# Patient Record
Sex: Male | Born: 1990 | Race: Black or African American | Hispanic: No | Marital: Single | Smoking: Current every day smoker
Health system: Southern US, Community
[De-identification: ages and names within clinical notes are randomized; demographics above are authoritative.]

---
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
## 2008-12-10 ENCOUNTER — Emergency Department (HOSPITAL_COMMUNITY): Admission: EM | Admit: 2008-12-10 | Discharge: 2008-12-11 | Payer: Self-pay | Admitting: Emergency Medicine

## 2010-09-08 LAB — GC/CHLAMYDIA PROBE AMP, GENITAL
Chlamydia, DNA Probe: POSITIVE — AB
GC Probe Amp, Genital: NEGATIVE

## 2010-09-08 LAB — RPR: RPR Ser Ql: NONREACTIVE

## 2011-06-12 ENCOUNTER — Emergency Department (HOSPITAL_COMMUNITY): Payer: Worker's Compensation

## 2011-06-12 ENCOUNTER — Encounter (HOSPITAL_COMMUNITY): Payer: Self-pay | Admitting: *Deleted

## 2011-06-12 ENCOUNTER — Emergency Department (HOSPITAL_COMMUNITY)
Admission: EM | Admit: 2011-06-12 | Discharge: 2011-06-13 | Disposition: A | Discharge status: Home / Self Care | Payer: Worker's Compensation | Attending: Emergency Medicine | Admitting: Emergency Medicine

## 2011-06-12 DIAGNOSIS — M79609 Pain in unspecified limb: Secondary | ICD-10-CM | POA: Insufficient documentation

## 2011-06-12 DIAGNOSIS — Y990 Civilian activity done for income or pay: Secondary | ICD-10-CM | POA: Insufficient documentation

## 2011-06-12 DIAGNOSIS — S62639B Displaced fracture of distal phalanx of unspecified finger, initial encounter for open fracture: Secondary | ICD-10-CM | POA: Insufficient documentation

## 2011-06-12 DIAGNOSIS — S61209A Unspecified open wound of unspecified finger without damage to nail, initial encounter: Secondary | ICD-10-CM | POA: Insufficient documentation

## 2011-06-12 DIAGNOSIS — Y9229 Other specified public building as the place of occurrence of the external cause: Secondary | ICD-10-CM | POA: Insufficient documentation

## 2011-06-12 DIAGNOSIS — W230XXA Caught, crushed, jammed, or pinched between moving objects, initial encounter: Secondary | ICD-10-CM | POA: Insufficient documentation

## 2011-06-12 DIAGNOSIS — S62609B Fracture of unspecified phalanx of unspecified finger, initial encounter for open fracture: Secondary | ICD-10-CM

## 2011-06-12 DIAGNOSIS — S6990XA Unspecified injury of unspecified wrist, hand and finger(s), initial encounter: Secondary | ICD-10-CM | POA: Insufficient documentation

## 2011-06-12 NOTE — ED Notes (Signed)
Pt to ED c/o smashing L middle finger in sliding door and cutting into nail bed.  Active bleeding noted through nail bed and pt c/o numbness and tingling.

## 2011-06-12 NOTE — ED Notes (Signed)
He was working at [employer] and he caught his lt middle finger tip in the drive through window.  His fingernail was partially torn loose from the nail bed.  Minimal bleeding at present

## 2011-06-13 MED ORDER — HYDROCODONE-ACETAMINOPHEN 5-325 MG PO TABS: 1.0000 | ORAL_TABLET | ORAL | Status: AC | PRN

## 2011-06-13 MED ORDER — OXYCODONE-ACETAMINOPHEN 5-325 MG PO TABS
1.0000 | ORAL_TABLET | Freq: Once | ORAL | Status: AC
  Administered 2011-06-13: 1 via ORAL
  Filled 2011-06-13: qty 1

## 2011-06-13 MED ORDER — CEFAZOLIN SODIUM 1 G IJ SOLR
1.0000 g | Freq: Once | INTRAMUSCULAR | Status: AC
  Administered 2011-06-13: 1 g via INTRAMUSCULAR
  Filled 2011-06-13 (×2): qty 10

## 2011-06-13 MED ORDER — TETANUS-DIPHTH-ACELL PERTUSSIS 5-2.5-18.5 LF-MCG/0.5 IM SUSP
0.5000 mL | Freq: Once | INTRAMUSCULAR | Status: AC
  Administered 2011-06-13: 0.5 mL via INTRAMUSCULAR
  Filled 2011-06-13: qty 0.5

## 2011-06-13 MED ORDER — IBUPROFEN 800 MG PO TABS
800.0000 mg | ORAL_TABLET | Freq: Once | ORAL | Status: AC
  Administered 2011-06-13: 800 mg via ORAL
  Filled 2011-06-13: qty 1

## 2011-06-13 NOTE — ED Provider Notes (Signed)
History     CSN: [id number]  Arrival date & time 06/12/11  2127   First MD Initiated Contact with Patient 06/13/11 0020      Chief Complaint  Patient presents with  . Finger Injury     Patient is a 21 y.o. male presenting with hand pain.  Hand Pain This is a new problem. The current episode started today. The problem occurs constantly. The problem has been gradually worsening.  Pt reports he was working at [employer] when he slammed the "drive through" window on his (L) middle finger. Has been difficult to get bleeding to stop.  History reviewed. No pertinent past medical history.  History reviewed. No pertinent past surgical history.  No family history on file.  History  Substance Use Topics  . Smoking status: Current Everyday Smoker  . Smokeless tobacco: Not on file  . Alcohol Use: Yes      Review of Systems  Constitutional: Negative.   HENT: Negative.   Eyes: Negative.   Respiratory: Negative.   Cardiovascular: Negative.   Gastrointestinal: Negative.   Genitourinary: Negative.   Musculoskeletal: Negative.   Skin: Negative.   Neurological: Negative.   Hematological: Negative.   Psychiatric/Behavioral: Negative.     Allergies  Review of patient's allergies indicates no known allergies.  Home Medications   Current Outpatient Rx  Name Route Sig Dispense Refill  . HYDROCODONE-ACETAMINOPHEN 5-325 MG PO TABS Oral Take 1 tablet by mouth every 4 (four) hours as needed for pain (1 to 2 tabs q4-6h PRN pain). 20 tablet 0    BP 148/98  Pulse 81  Temp(Src) 98.4 F (36.9 C) (Oral)  Resp 18  SpO2 99%  Physical Exam  Constitutional: He is oriented to person, place, and time. He appears well-developed and well-nourished.  HENT:  Head: Normocephalic and atraumatic.  Eyes: Conjunctivae are normal.  Neck: Neck supple.  Cardiovascular: Normal rate.   Pulmonary/Chest: Effort normal.  Musculoskeletal: Normal range of motion.       Hands:      Partial nail  avulsion to (L) middle finger. Soft tissue of exposed nailbed somewhat macerated, remaining nail to proximal portion of nailbed intact. Bleeding controlled.  Neurological: He is alert and oriented to person, place, and time.  Skin: Skin is warm and dry.  Psychiatric: He has a normal mood and affect.    ED Course  Procedures wound cleaned well. Xray shows mildly displaced comminuted fx of finger tuft (L) 3rd finger. Will consult w/ hand for plan.  0125:  I have spoken w/ Dr Amanda Pea by phone who has recommended  bulky dressing, finger splint, Ancef 1 GM and f/u in office in am. Pt is agreeable w/ plan.       Labs Reviewed - No data to display Dg Finger Middle Left  06/12/2011  *RADIOLOGY REPORT*  Clinical Data: Finger injury.  Terminal phalanx pain.  LEFT MIDDLE FINGER 2+V  Comparison: None.  Findings: There is a mildly displaced comminuted fracture of the finger tuft of the left long finger.  Alignment of the finger is anatomic.  Soft tissue swelling overlies the terminal phalanx.  IMPRESSION: Mildly displaced finger tuft fracture of the left terminal phalanx.  Original Report Authenticated By: Andreas Newport, M.D.     1. Open fracture of finger       MDM  HPI/PE and clinical findings c/w open tuft fx of (L) 3rd finger w/ partial nail avulsion. Bleeding controlled, remaining nail intact.  Leanne Chang, NP 06/13/11 [phone]

## 2011-06-13 NOTE — ED Provider Notes (Signed)
Medical screening examination/treatment/procedure(s) were performed by non-physician practitioner and as supervising physician I was immediately available for consultation/collaboration.  Jasmine Awe, MD 06/13/11 2322

## 2011-08-25 ENCOUNTER — Emergency Department (INDEPENDENT_AMBULATORY_CARE_PROVIDER_SITE_OTHER)
Admission: EM | Admit: 2011-08-25 | Discharge: 2011-08-25 | Disposition: A | Discharge status: Home / Self Care | Payer: BC Managed Care – PPO | Source: Home / Self Care | Attending: Emergency Medicine | Admitting: Emergency Medicine

## 2011-08-25 ENCOUNTER — Encounter (HOSPITAL_COMMUNITY): Payer: Self-pay

## 2011-08-25 DIAGNOSIS — R6889 Other general symptoms and signs: Secondary | ICD-10-CM

## 2011-08-25 DIAGNOSIS — J111 Influenza due to unidentified influenza virus with other respiratory manifestations: Secondary | ICD-10-CM

## 2011-08-25 MED ORDER — ACETAMINOPHEN-CODEINE #3 300-30 MG PO TABS: 1.0000 | ORAL_TABLET | Freq: Four times a day (QID) | ORAL | Status: AC | PRN

## 2011-08-25 NOTE — ED Notes (Signed)
Prescription given with discharge instructions.  

## 2011-08-25 NOTE — ED Provider Notes (Signed)
History     CSN: [id number]  Arrival date & time 08/25/11  4098   First MD Initiated Contact with Patient 08/25/11 1057      Chief Complaint  Patient presents with  . URI    (Consider location/radiation/quality/duration/timing/severity/associated sxs/prior treatment) HPI Comments: Patient presents urgent care complaining of generalized body aches cough and runny and congested nose since yesterday. Have perceived some fevers (tactile) at home, no shortness of breath, no vomiting no nausea. Feels like he has the flu.  Patient is a 21 y.o. male presenting with URI. The history is provided by the patient.  URI The primary symptoms include fever, fatigue, sore throat, cough and arthralgias. Primary symptoms do not include ear pain, wheezing, vomiting or myalgias. The current episode started 2 days ago. This is a new problem. The problem has not changed since onset. Symptoms associated with the illness include congestion and rhinorrhea.    History reviewed. No pertinent past medical history.  History reviewed. No pertinent past surgical history.  No family history on file.  History  Substance Use Topics  . Smoking status: Current Everyday Smoker  . Smokeless tobacco: Not on file  . Alcohol Use: Yes      Review of Systems  Constitutional: Positive for fever, appetite change and fatigue.  HENT: Positive for congestion, sore throat and rhinorrhea. Negative for ear pain, neck pain and neck stiffness.   Eyes: Negative for pain.  Respiratory: Positive for cough. Negative for chest tightness and wheezing.   Gastrointestinal: Negative for vomiting.  Genitourinary: Negative for dysuria.  Musculoskeletal: Positive for arthralgias. Negative for myalgias and back pain.    Allergies  Review of patient's allergies indicates no known allergies.  Home Medications   Current Outpatient Rx  Name Route Sig Dispense Refill  . ACETAMINOPHEN-CODEINE #3 300-30 MG PO TABS Oral Take 1-2  tablets by mouth every 6 (six) hours as needed for pain. 15 tablet 0    BP 108/59  Pulse 113  Temp(Src) 99.5 F (37.5 C) (Oral)  Resp 20  SpO2 100%  Physical Exam  Nursing note and vitals reviewed. Constitutional: He appears well-developed and well-nourished.  HENT:  Head: Normocephalic.  Right Ear: Tympanic membrane normal.  Left Ear: Tympanic membrane normal.  Nose: Rhinorrhea present.  Mouth/Throat: Uvula is midline and mucous membranes are normal. Posterior oropharyngeal erythema present.  Eyes: Conjunctivae are normal.  Neck: Neck supple. No JVD present.  Cardiovascular: Normal rate.   Pulmonary/Chest: Effort normal. No respiratory distress. He has no decreased breath sounds. He has no wheezes. He has no rhonchi. He has no rales. He exhibits no tenderness.  Abdominal: Soft. There is no tenderness.  Lymphadenopathy:    He has no cervical adenopathy.  Neurological: He is alert. He exhibits normal muscle tone.  Skin: No rash noted. No erythema.    ED Course  Procedures (including critical care time)  Labs Reviewed - No data to display No results found.   1. Influenza-like symptoms       MDM  Upper respiratory symptoms since yesterday patient with marked upper respiratory congestion. No respiratory distress normal lung exam patient symptomatic and tolerating oral route well no chronic medical problems have encouraged him and instructed him if symptoms were to change or worsen to return for reevaluation symptoms and exam were consistent with an influenza-like syndrome        Jimmie Molly, MD 08/25/11 1226

## 2011-08-25 NOTE — ED Notes (Signed)
C/o bodyaches, sore throat, nasal congestion, cough, and post nasal drainage since yesterday.

## 2011-08-25 NOTE — Discharge Instructions (Signed)
As discussed- drink plenty of fluids rest- your symptoms and exam were consistent with a viral respiratory infection- return if changes such as shortness of breath or no improvement after 3 days

## 2018-01-22 ENCOUNTER — Other Ambulatory Visit: Payer: Self-pay

## 2018-01-22 ENCOUNTER — Emergency Department (HOSPITAL_COMMUNITY)
Admission: EM | Admit: 2018-01-22 | Discharge: 2018-01-23 | Disposition: A | Discharge status: Home / Self Care | Payer: Self-pay | Attending: Emergency Medicine | Admitting: Emergency Medicine

## 2018-01-22 ENCOUNTER — Encounter (HOSPITAL_COMMUNITY): Payer: Self-pay

## 2018-01-22 DIAGNOSIS — R1084 Generalized abdominal pain: Secondary | ICD-10-CM | POA: Insufficient documentation

## 2018-01-22 DIAGNOSIS — F17210 Nicotine dependence, cigarettes, uncomplicated: Secondary | ICD-10-CM | POA: Insufficient documentation

## 2018-01-22 DIAGNOSIS — M545 Low back pain, unspecified: Secondary | ICD-10-CM

## 2018-01-22 NOTE — ED Notes (Signed)
Bed: [facility] Expected date:  Expected time:  Means of arrival:  Comments: 

## 2018-01-22 NOTE — ED Notes (Signed)
Bed: [facility] Expected date:  Expected time:  Means of arrival:  Comments: 
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
## 2018-01-23 ENCOUNTER — Emergency Department (HOSPITAL_COMMUNITY): Payer: Self-pay

## 2018-01-23 LAB — I-STAT CHEM 8, ED
BUN: 7 mg/dL (ref 6–20)
CHLORIDE: 104 mmol/L (ref 98–111)
Calcium, Ion: 1.14 mmol/L — ABNORMAL LOW (ref 1.15–1.40)
Creatinine, Ser: 1 mg/dL (ref 0.61–1.24)
Glucose, Bld: 84 mg/dL (ref 70–99)
HEMATOCRIT: 45 % (ref 39.0–52.0)
Hemoglobin: 15.3 g/dL (ref 13.0–17.0)
POTASSIUM: 3.3 mmol/L — AB (ref 3.5–5.1)
Sodium: 142 mmol/L (ref 135–145)
TCO2: 25 mmol/L (ref 22–32)

## 2018-01-23 MED ORDER — IOPAMIDOL (ISOVUE-300) INJECTION 61%
INTRAVENOUS | Status: AC
  Filled 2018-01-23: qty 100

## 2018-01-23 MED ORDER — POTASSIUM CHLORIDE CRYS ER 20 MEQ PO TBCR
40.0000 meq | EXTENDED_RELEASE_TABLET | Freq: Once | ORAL | Status: AC
Start: 2018-01-23 — End: 2018-01-23
  Administered 2018-01-23: 40 meq via ORAL
  Filled 2018-01-23: qty 2

## 2018-01-23 MED ORDER — IOPAMIDOL (ISOVUE-300) INJECTION 61%
100.0000 mL | Freq: Once | INTRAVENOUS | Status: AC | PRN
  Administered 2018-01-23: 100 mL via INTRAVENOUS

## 2018-01-23 MED ORDER — NAPROXEN 375 MG PO TABS: 375.0000 mg | ORAL_TABLET | Freq: Two times a day (BID) | ORAL | 0 refills | Status: AC

## 2018-01-23 MED ORDER — METHOCARBAMOL 500 MG PO TABS: 500.0000 mg | ORAL_TABLET | Freq: Two times a day (BID) | ORAL | 0 refills | Status: AC
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
## 2019-06-07 ENCOUNTER — Ambulatory Visit: Payer: Self-pay | Attending: Internal Medicine

## 2019-06-07 DIAGNOSIS — Z20822 Contact with and (suspected) exposure to covid-19: Secondary | ICD-10-CM

## 2019-06-09 LAB — NOVEL CORONAVIRUS, NAA: SARS-CoV-2, NAA: NOT DETECTED
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
## 2021-01-02 ENCOUNTER — Emergency Department (HOSPITAL_COMMUNITY)
Admission: EM | Admit: 2021-01-02 | Discharge: 2021-01-02 | Disposition: A | Discharge status: Home / Self Care | Payer: Self-pay | Attending: Emergency Medicine | Admitting: Emergency Medicine

## 2021-01-02 ENCOUNTER — Encounter (HOSPITAL_COMMUNITY): Payer: Self-pay

## 2021-01-02 ENCOUNTER — Other Ambulatory Visit: Payer: Self-pay

## 2021-01-02 DIAGNOSIS — F17200 Nicotine dependence, unspecified, uncomplicated: Secondary | ICD-10-CM | POA: Insufficient documentation

## 2021-01-02 DIAGNOSIS — L0201 Cutaneous abscess of face: Secondary | ICD-10-CM | POA: Insufficient documentation

## 2021-01-02 MED ORDER — DOXYCYCLINE HYCLATE 100 MG PO CAPS: 100.0000 mg | ORAL_CAPSULE | Freq: Two times a day (BID) | ORAL | 0 refills | Status: AC

## 2021-01-02 MED ORDER — LIDOCAINE-EPINEPHRINE (PF) 2 %-1:200000 IJ SOLN
10.0000 mL | Freq: Once | INTRAMUSCULAR | Status: DC
  Filled 2021-01-02: qty 20

## 2021-01-02 NOTE — ED Triage Notes (Signed)
Patient here with abscess to left jaw/chin area x 1 week, minimal to no drainage and complains of increased pain

## 2021-01-02 NOTE — Discharge Instructions (Addendum)
I prescribed you an antibiotic called doxycycline.  Please take this twice a day for the next 7 days.  Do not stop taking this medication early even if you feel your symptoms have improved.  Continue to apply warm compresses to the region.  I would also recommend warm showers.  This will help additional pus drain from the site.  If you develop worsening pain, swelling, fevers, vomiting, please come back to the emergency department for reevaluation.  It was a pleasure to meet you.

## 2021-01-02 NOTE — ED Provider Notes (Signed)
Emergency Medicine Provider Triage Evaluation Note  Juan Larson , a 30 y.o. male  was evaluated in triage.  Pt complains of 1 week history of swelling and pain of the left side jaw.  Now pain is radiating toward his neck.  Minimal drainage.  No fevers.  Associate dental pain, but this began after the swelling.  Review of Systems  Positive: Facial swelling Negative: fever  Physical Exam  BP (!) 137/91 (BP Location: Right Arm)   Pulse 70   Temp 98.5 F (36.9 C) (Oral)   Resp 18   SpO2 100%  Gen:   Awake, no distress   Resp:  Normal effort  MSK:   Moves extremities without difficulty  Other:  Large area of fluctuance of the left side of the jaw.  No dental pain.  No trismus.  No swelling in the neck.  Associated left-sided cervical lymphadenopathy.  Medical Decision Making  Medically screening exam initiated at 1:25 PM.  Appropriate orders placed.  Juan Larson was informed that the remainder of the evaluation will be completed by another provider, this initial triage assessment does not replace that evaluation, and the importance of remaining in the ED until their evaluation is complete.     Alveria Apley, PA-C 01/02/21 1326    Gloris Manchester, MD 01/04/21 [phone]

## 2021-01-02 NOTE — ED Notes (Signed)
DC instructions reviewed with pt. PT verbalized understanding. PT DC °

## 2021-01-02 NOTE — ED Provider Notes (Signed)
[facility] EMERGENCY DEPARTMENT Provider Note   CSN: [id number] Arrival date & time: 01/02/21  1253     History Chief Complaint  Patient presents with   Abscess    Juan Larson is a 30 y.o. male.  HPI Patient is a 30 year old male who presents to the emergency department due to an abscess to the left jawline.  Patient states that started as a small bump about 1 week ago and that has been progressively worsening.  States his pain is radiating posteriorly towards the neck.  Denies any neck or ear pain.  Does report having a left lower broken tooth but denies any dental pain.  No fevers or vomiting.    History reviewed. No pertinent past medical history.  There are no problems to display for this patient.   History reviewed. No pertinent surgical history.     No family history on file.  Social History   Tobacco Use   Smoking status: Every Day  Substance Use Topics   Alcohol use: Yes   Drug use: No    Home Medications Prior to Admission medications   Medication Sig Start Date End Date Taking? Authorizing Provider  doxycycline (VIBRAMYCIN) 100 MG capsule Take 1 capsule (100 mg total) by mouth 2 (two) times daily for 7 days. 01/02/21 01/09/21 Yes Placido Sou, PA-C  methocarbamol (ROBAXIN) 500 MG tablet Take 1 tablet (500 mg total) by mouth 2 (two) times daily. 01/23/18   Rise Mu, PA-C  naproxen (NAPROSYN) 375 MG tablet Take 1 tablet (375 mg total) by mouth 2 (two) times daily. 01/23/18   Rise Mu, PA-C    Allergies    Penicillins  Review of Systems   Review of Systems  All other systems reviewed and are negative. Ten systems reviewed and are negative for acute change, except as noted in the HPI.   Physical Exam Updated Vital Signs BP (!) 137/91 (BP Location: Right Arm)   Pulse 70   Temp 98.5 F (36.9 C) (Oral)   Resp 18   SpO2 100%   Physical Exam Vitals and nursing note reviewed.  Constitutional:      General: He  is not in acute distress.    Appearance: Normal appearance. He is not ill-appearing, toxic-appearing or diaphoretic.  HENT:     Head: Normocephalic and atraumatic.     Right Ear: External ear normal.     Left Ear: External ear normal.     Nose: Nose normal.     Mouth/Throat:     Mouth: Mucous membranes are moist.     Pharynx: Oropharynx is clear. No oropharyngeal exudate or posterior oropharyngeal erythema.     Comments: Fractured left lower molar.  No erythema or fluctuance noted in the intraoral space.  No tenderness appreciated with manipulation of the left lower molars.  Readily handling secretions.  Uvula midline. Eyes:     Extraocular Movements: Extraocular movements intact.  Cardiovascular:     Rate and Rhythm: Normal rate.     Pulses: Normal pulses.     Heart sounds:    No friction rub.  Pulmonary:     Effort: Pulmonary effort is normal.  Abdominal:     General: Abdomen is flat.     Tenderness: There is no abdominal tenderness.  Musculoskeletal:        General: Tenderness present. Normal range of motion.     Cervical back: Normal range of motion and neck supple. No tenderness.  Skin:  General: Skin is warm and dry.     Findings: Erythema present.     Comments: 4 cm region of induration to the left jawline.  Overlying erythema.  Consistent with abscess.  Neurological:     General: No focal deficit present.     Mental Status: He is alert and oriented to person, place, and time.  Psychiatric:        Mood and Affect: Mood normal.        Behavior: Behavior normal.    ED Results / Procedures / Treatments   Labs (all labs ordered are listed, but only abnormal results are displayed) Labs Reviewed - No data to display  EKG None  Radiology No results found.  Procedures .Marland KitchenIncision and Drainage  Date/Time: 01/02/2021 4:23 PM Performed by: Placido Sou, PA-C Authorized by: Placido Sou, PA-C   Consent:    Consent obtained:  Verbal   Consent given by:   Patient   Risks, benefits, and alternatives were discussed: yes     Risks discussed:  Bleeding, incomplete drainage, pain and infection Universal protocol:    Procedure explained and questions answered to patient or proxy's satisfaction: yes     Relevant documents present and verified: yes     Test results available : yes     Site/side marked: yes     Immediately prior to procedure, a time out was called: yes     Patient identity confirmed:  Verbally with patient and arm band Location:    Type:  Abscess   Size:  4 cm   Location:  Head   Head/neck location: Left jaw. Pre-procedure details:    Skin preparation:  Chlorhexidine Anesthesia:    Anesthesia method:  Local infiltration   Local anesthetic:  Lidocaine 2% WITH epi Procedure type:    Complexity:  Simple Procedure details:    Needle aspiration: yes     Needle size:  25 G   Incision types:  Stab incision   Incision depth:  Dermal   Wound management:  Probed and deloculated and extensive cleaning   Drainage:  Purulent   Drainage amount:  Moderate   Wound treatment:  Wound left open   Packing materials:  None Post-procedure details:    Procedure completion:  Tolerated well, no immediate complications   Medications Ordered in ED Medications  lidocaine-EPINEPHrine (XYLOCAINE W/EPI) 2 %-1:200000 (PF) injection 10 mL (has no administration in time range)   ED Course  I have reviewed the triage vital signs and the nursing notes.  Pertinent labs & imaging results that were available during my care of the patient were reviewed by me and considered in my medical decision making (see chart for details).    MDM Rules/Calculators/A&P                          Patient is a 30 year old male who presents to the emergency department with a developing abscess to the left jaw.  No known history of diabetes mellitus.  Symptoms started about 1 week ago and have been progressively worsening.  Patient is afebrile and not tachycardic.   Denies any systemic complaint such as fevers, chills, nausea, or vomiting.  Performed I&D of the region.  Please see the procedure note above.  We will discharge the patient on a course of doxycycline.  Discussed return precautions in length.  Feel he is stable for discharge at this time and he is agreeable.  His questions were answered and he was  amicable at the time of discharge.  Final Clinical Impression(s) / ED Diagnoses Final diagnoses:  Facial abscess    Rx / DC Orders ED Discharge Orders          Ordered    doxycycline (VIBRAMYCIN) 100 MG capsule  2 times daily        01/02/21 1625             Placido Sou, PA-C 01/02/21 1627    Maia Plan, MD 01/03/21 [phone]
